# Patient Record
Sex: Female | Born: 1945 | Race: Black or African American | Hispanic: No | Smoking: Never smoker
Health system: Southern US, Community
[De-identification: ages and names within clinical notes are randomized; demographics above are authoritative.]

## PROBLEM LIST (undated history)

## (undated) DIAGNOSIS — E7800 Pure hypercholesterolemia, unspecified: Secondary | ICD-10-CM

## (undated) DIAGNOSIS — I10 Essential (primary) hypertension: Secondary | ICD-10-CM

## (undated) DIAGNOSIS — E119 Type 2 diabetes mellitus without complications: Secondary | ICD-10-CM

## (undated) HISTORY — PX: JOINT REPLACEMENT: SHX530

## (undated) HISTORY — PX: TRIGGER FINGER RELEASE: SHX641

## (undated) HISTORY — PX: CARPAL TUNNEL RELEASE: SHX101

---

## 2014-11-05 DEATH — deceased

## 2018-11-15 ENCOUNTER — Emergency Department (HOSPITAL_COMMUNITY)
Admission: EM | Admit: 2018-11-15 | Discharge: 2018-11-15 | Disposition: A | Discharge status: Home / Self Care | Payer: Medicare (Managed Care) | Attending: Emergency Medicine | Admitting: Emergency Medicine

## 2018-11-15 ENCOUNTER — Emergency Department (HOSPITAL_COMMUNITY): Payer: Medicare (Managed Care)

## 2018-11-15 ENCOUNTER — Encounter (HOSPITAL_COMMUNITY): Payer: Self-pay

## 2018-11-15 ENCOUNTER — Other Ambulatory Visit: Payer: Self-pay

## 2018-11-15 DIAGNOSIS — Y929 Unspecified place or not applicable: Secondary | ICD-10-CM | POA: Diagnosis not present

## 2018-11-15 DIAGNOSIS — Y999 Unspecified external cause status: Secondary | ICD-10-CM | POA: Diagnosis not present

## 2018-11-15 DIAGNOSIS — Y9389 Activity, other specified: Secondary | ICD-10-CM | POA: Insufficient documentation

## 2018-11-15 DIAGNOSIS — S7001XA Contusion of right hip, initial encounter: Secondary | ICD-10-CM | POA: Insufficient documentation

## 2018-11-15 DIAGNOSIS — I10 Essential (primary) hypertension: Secondary | ICD-10-CM | POA: Diagnosis not present

## 2018-11-15 DIAGNOSIS — E119 Type 2 diabetes mellitus without complications: Secondary | ICD-10-CM | POA: Diagnosis not present

## 2018-11-15 DIAGNOSIS — W182XXA Fall in (into) shower or empty bathtub, initial encounter: Secondary | ICD-10-CM | POA: Diagnosis not present

## 2018-11-15 DIAGNOSIS — S79811A Other specified injuries of right hip, initial encounter: Secondary | ICD-10-CM | POA: Diagnosis present

## 2018-11-15 DIAGNOSIS — S62655A Nondisplaced fracture of medial phalanx of left ring finger, initial encounter for closed fracture: Secondary | ICD-10-CM | POA: Insufficient documentation

## 2018-11-15 DIAGNOSIS — W19XXXA Unspecified fall, initial encounter: Secondary | ICD-10-CM

## 2018-11-15 HISTORY — DX: Type 2 diabetes mellitus without complications: E11.9

## 2018-11-15 HISTORY — DX: Essential (primary) hypertension: I10

## 2018-11-15 HISTORY — DX: Pure hypercholesterolemia, unspecified: E78.00

## 2018-11-15 NOTE — Discharge Instructions (Addendum)
X-rays of hip were negative.  You have a very tiny fracture on the middle bone of your left ring finger.  You will be sore for several days.  Splint, ice, Tylenol or ibuprofen for pain.

## 2018-11-15 NOTE — ED Triage Notes (Signed)
Pt reports she fell getting out of the shower and c/o pain to lower back and left ring finger.

## 2018-11-15 NOTE — ED Provider Notes (Signed)
[facility] EMERGENCY DEPARTMENT Provider Note   CSN: [id number] Arrival date & time: 11/15/18  4098     History   Chief Complaint Chief Complaint  Patient presents with  . Fall    HPI Emily Tyler is a 72 y.o. female.  Accidental trip and fall while getting out of the shower yesterday.  Complains of pain in the right posterior hip and left ring finger.  No other injuries.  Specifically no head or neck trauma.  Severity of pain is mild.  Position and palpation make pain worse.      Past Medical History:  Diagnosis Date  . Diabetes mellitus without complication (HCC)   . Hypercholesterolemia   . Hypertension     There are no active problems to display for this patient.   Past Surgical History:  Procedure Laterality Date  . CARPAL TUNNEL RELEASE    . JOINT REPLACEMENT    . TRIGGER FINGER RELEASE       OB History   None      Home Medications    Prior to Admission medications   Not on File    Family History No family history on file.  Social History Social History   Tobacco Use  . Smoking status: Never Smoker  . Smokeless tobacco: Never Used  Substance Use Topics  . Alcohol use: Yes    Frequency: Never    Comment: occ  . Drug use: Never     Allergies   Shellfish allergy   Review of Systems Review of Systems  All other systems reviewed and are negative.    Physical Exam Updated Vital Signs BP (!) 150/64   Pulse 60   Temp 98.1 F (36.7 C) (Oral)   Resp 18   Ht 5\' 5"  (1.651 m)   Wt 105.2 kg   SpO2 99%   BMI 38.61 kg/m   Physical Exam  Constitutional: She is oriented to person, place, and time. She appears well-developed and well-nourished.  HENT:  Head: Normocephalic and atraumatic.  Eyes: Conjunctivae are normal.  Neck: Neck supple.  Cardiovascular: Normal rate and regular rhythm.  Pulmonary/Chest: Effort normal and breath sounds normal.  Abdominal: Soft. Bowel sounds are normal.  Musculoskeletal:  Right hip: Tender  posterior superior aspect.  Left ring finger: Tender at DIP joint.  Pain with range of motion.  Neurological: She is alert and oriented to person, place, and time.  Skin: Skin is warm and dry.  Psychiatric: She has a normal mood and affect. Her behavior is normal.  Nursing note and vitals reviewed.    ED Treatments / Results  Labs (all labs ordered are listed, but only abnormal results are displayed) Labs Reviewed - No data to display  EKG None  Radiology Dg Finger Ring Left  Result Date: 11/15/2018 CLINICAL DATA:  Pain following fall EXAM: LEFT FOURTH FINGER 2+V COMPARISON:  None. FINDINGS: Frontal, oblique, and lateral views were obtained. There is an avulsion type fracture arising from the medial distal aspect of the fourth middle phalanx. The a avulsed fragment is located dorsal to the fourth DIP joint. No other fracture evident. No dislocation. There is narrowing of the fourth PIP and DIP joints without erosion. IMPRESSION: Avulsion type fracture along the medial aspect of the distal aspect of the fourth middle phalanx. The avulsed fragment is located dorsally at the fourth DIP joint level. No other fractures are evident. No dislocation. 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 Electronically Signed   By: Chrissie Noa  Margarita GrizzleWoodruff III M.D.   On: 11/15/2018 09:00   Dg Hip Unilat W Or Wo Pelvis 2-3 Views Right  Result Date: 11/15/2018 CLINICAL DATA:  Pain following fall EXAM: DG HIP (WITH OR WITHOUT PELVIS) 2-3V RIGHT COMPARISON:  None. FINDINGS: Frontal pelvis as well as frontal and lateral right hip images were obtained. There is no appreciable fracture or dislocation. There is moderate symmetric narrowing of each hip joint. Sacroiliac joints appear normal. No erosive change. IMPRESSION: Symmetric moderate narrowing of each hip joint. No fracture or dislocation. Electronically Signed   By: Bretta BangWilliam  Woodruff III M.D.   On: 11/15/2018 08:57    Procedures Procedures  (including critical care time)  Medications Ordered in ED Medications - No data to display   Initial Impression / Assessment and Plan / ED Course  I have reviewed the triage vital signs and the nursing notes.  Pertinent labs & imaging results that were available during my care of the patient were reviewed by me and considered in my medical decision making (see chart for details).     Status post accidental mechanical fall.  Neuro intact.  Plain films of right hip negative.  Plain films of left index finger reveal an avulsion fracture of the medial distal aspect of the middle phalanx.  Discussed with patient.  Finger splint.  Ibuprofen for pain.  Final Clinical Impressions(s) / ED Diagnoses   Final diagnoses:  Fall, initial encounter  Closed nondisplaced fracture of middle phalanx of left ring finger, initial encounter  Contusion of right hip, initial encounter    ED Discharge Orders    None       Donnetta Hutchingook, Chaquetta Schlottman, MD 11/15/18 [phone]

## 2019-10-28 IMAGING — DX DG FINGER RING 2+V*L*
3 series · 3 of 3 positions shown · non-contrast
Comparison: None.

CLINICAL DATA: Pain following fall

EXAM:
LEFT FOURTH FINGER 2+V

[finger ap]
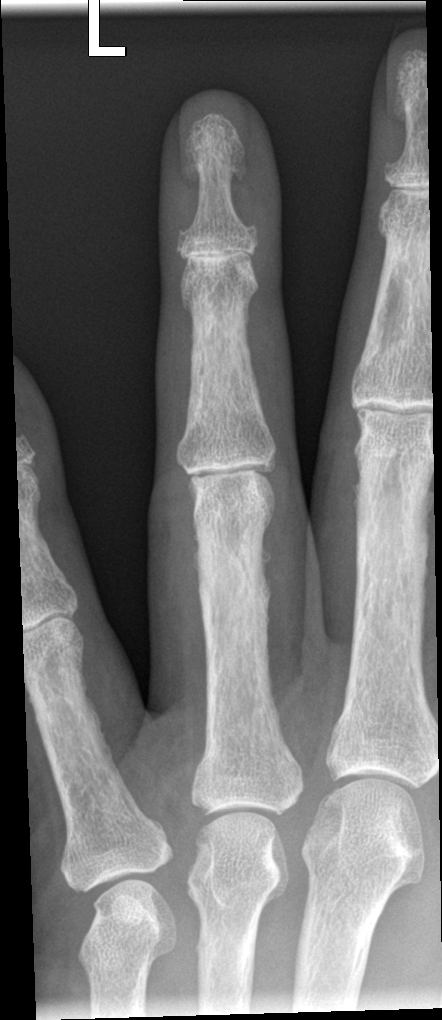

[finger obl]
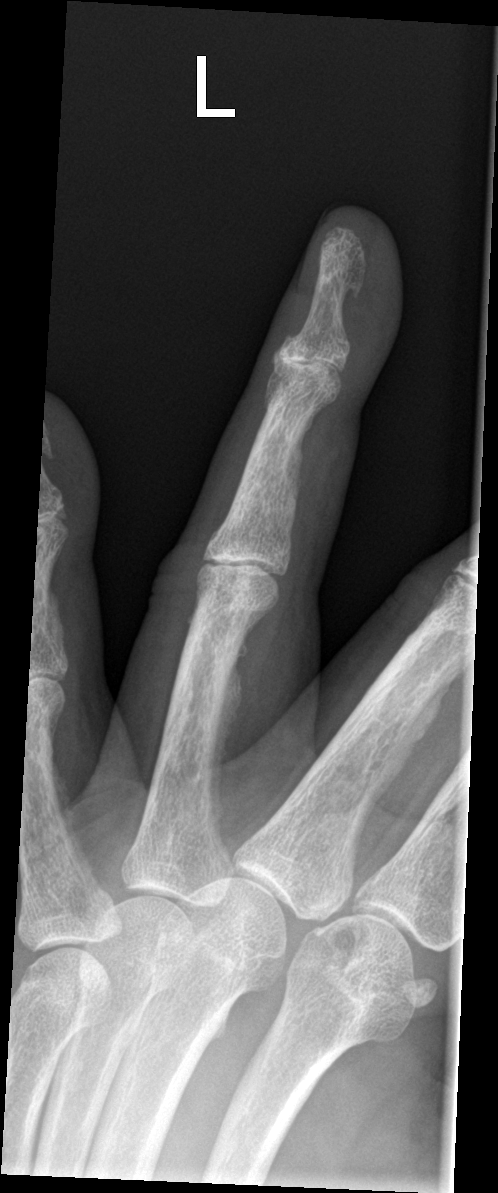

[finger lat]
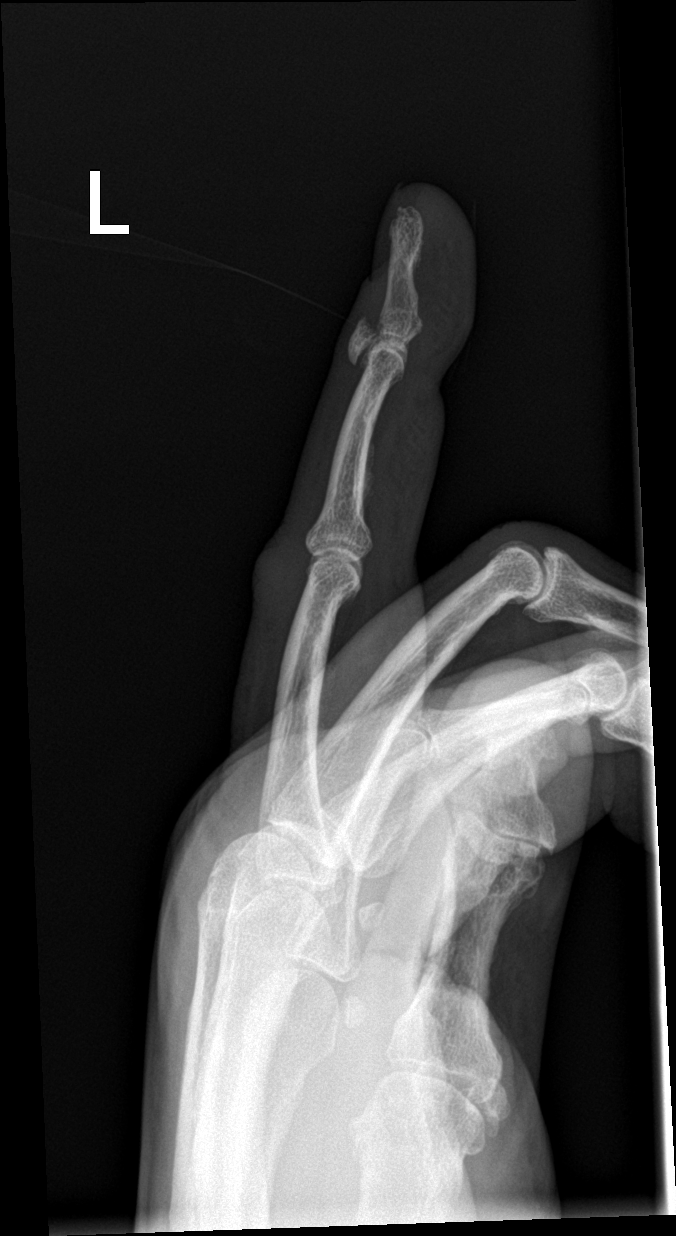

[3 of 3 positions shown; findings below may reference images not displayed]

FINDINGS: Frontal, oblique, and lateral views were obtained. There is an
avulsion type fracture arising from the medial distal aspect of the
fourth middle phalanx. The a avulsed fragment is located dorsal to
the fourth DIP joint. No other fracture evident. No dislocation.
There is narrowing of the fourth PIP and DIP joints without erosion.
IMPRESSION: Avulsion type fracture along the medial aspect of the distal aspect
of the fourth middle phalanx. The avulsed fragment is located
dorsally at the fourth DIP joint level. No other fractures are
evident. No dislocation. There's osteoarthritic change in the fourth
PIP and DIP joints.
